# Patient Record
Sex: Male | Born: 1970 | Race: Black or African American | Hispanic: No | Marital: Married | Smoking: Current every day smoker
Health system: Southern US, Community
[De-identification: ages and names within clinical notes are randomized; demographics above are authoritative.]

## PROBLEM LIST (undated history)

## (undated) DIAGNOSIS — I10 Essential (primary) hypertension: Secondary | ICD-10-CM

## (undated) HISTORY — PX: ARTHROSCOPIC REPAIR ACL: SUR80

---

## 2021-01-29 ENCOUNTER — Emergency Department: Payer: Self-pay

## 2021-01-29 ENCOUNTER — Encounter: Payer: Self-pay | Admitting: Emergency Medicine

## 2021-01-29 ENCOUNTER — Emergency Department
Admission: EM | Admit: 2021-01-29 | Discharge: 2021-01-29 | Disposition: A | Discharge status: Home / Self Care | Payer: Self-pay | Attending: Emergency Medicine | Admitting: Emergency Medicine

## 2021-01-29 ENCOUNTER — Other Ambulatory Visit: Payer: Self-pay

## 2021-01-29 ENCOUNTER — Emergency Department
Admission: EM | Admit: 2021-01-29 | Discharge: 2021-01-29 | Discharge status: Against Medical Advice | Payer: Self-pay | Attending: Emergency Medicine | Admitting: Emergency Medicine

## 2021-01-29 ENCOUNTER — Other Ambulatory Visit: Payer: Self-pay | Admitting: Radiology

## 2021-01-29 DIAGNOSIS — M25552 Pain in left hip: Secondary | ICD-10-CM | POA: Insufficient documentation

## 2021-01-29 DIAGNOSIS — F10129 Alcohol abuse with intoxication, unspecified: Secondary | ICD-10-CM | POA: Insufficient documentation

## 2021-01-29 DIAGNOSIS — E876 Hypokalemia: Secondary | ICD-10-CM | POA: Insufficient documentation

## 2021-01-29 DIAGNOSIS — F10920 Alcohol use, unspecified with intoxication, uncomplicated: Secondary | ICD-10-CM

## 2021-01-29 DIAGNOSIS — S0291XA Unspecified fracture of skull, initial encounter for closed fracture: Secondary | ICD-10-CM

## 2021-01-29 DIAGNOSIS — S299XXA Unspecified injury of thorax, initial encounter: Secondary | ICD-10-CM | POA: Insufficient documentation

## 2021-01-29 DIAGNOSIS — S0219XA Other fracture of base of skull, initial encounter for closed fracture: Secondary | ICD-10-CM | POA: Insufficient documentation

## 2021-01-29 DIAGNOSIS — R110 Nausea: Secondary | ICD-10-CM | POA: Insufficient documentation

## 2021-01-29 DIAGNOSIS — T1490XA Injury, unspecified, initial encounter: Secondary | ICD-10-CM

## 2021-01-29 DIAGNOSIS — S0990XA Unspecified injury of head, initial encounter: Secondary | ICD-10-CM

## 2021-01-29 DIAGNOSIS — S0101XA Laceration without foreign body of scalp, initial encounter: Secondary | ICD-10-CM | POA: Insufficient documentation

## 2021-01-29 DIAGNOSIS — W108XXA Fall (on) (from) other stairs and steps, initial encounter: Secondary | ICD-10-CM | POA: Insufficient documentation

## 2021-01-29 DIAGNOSIS — W109XXA Fall (on) (from) unspecified stairs and steps, initial encounter: Secondary | ICD-10-CM | POA: Insufficient documentation

## 2021-01-29 LAB — COMPREHENSIVE METABOLIC PANEL
ALT: 19 U/L (ref 0–44)
AST: 25 U/L (ref 15–41)
Albumin: 4.7 g/dL (ref 3.5–5.0)
Alkaline Phosphatase: 55 U/L (ref 38–126)
Anion gap: 14 (ref 5–15)
BUN: 12 mg/dL (ref 6–20)
CO2: 23 mmol/L (ref 22–32)
Calcium: 8.9 mg/dL (ref 8.9–10.3)
Chloride: 97 mmol/L — ABNORMAL LOW (ref 98–111)
Creatinine, Ser: 0.91 mg/dL (ref 0.61–1.24)
GFR, Estimated: >60 mL/min (ref >60)
Glucose, Bld: 87 mg/dL (ref 70–99)
Potassium: 2.9 mmol/L — ABNORMAL LOW (ref 3.5–5.1)
Sodium: 134 mmol/L — ABNORMAL LOW (ref 135–145)
Total Bilirubin: 0.7 mg/dL (ref 0.3–1.2)
Total Protein: 7.6 g/dL (ref 6.5–8.1)

## 2021-01-29 LAB — CBC
HCT: 39.3 % (ref 39.0–52.0)
Hemoglobin: 13.9 g/dL (ref 13.0–17.0)
MCH: 28.9 pg (ref 26.0–34.0)
MCHC: 35.4 g/dL (ref 30.0–36.0)
MCV: 81.7 fL (ref 80.0–100.0)
Platelets: 439 10*3/uL — ABNORMAL HIGH (ref 150–400)
RBC: 4.81 MIL/uL (ref 4.22–5.81)
RDW: 11.9 % (ref 11.5–15.5)
WBC: 9.9 10*3/uL (ref 4.0–10.5)
nRBC: 0 % (ref 0.0–0.2)

## 2021-01-29 LAB — MAGNESIUM: Magnesium: 2.6 mg/dL — ABNORMAL HIGH (ref 1.7–2.4)

## 2021-01-29 LAB — ETHANOL: Alcohol, Ethyl (B): 256 mg/dL — ABNORMAL HIGH (ref <10)

## 2021-01-29 LAB — CBG MONITORING, ED: Glucose-Capillary: 94 mg/dL (ref 70–99)

## 2021-01-29 IMAGING — CT CT CERVICAL SPINE W/O CM
3 of 4 series · 10 of 33 positions shown, 12 images · non-contrast
Comparison: None.
COMPARISON: None.

Addendum:
CLINICAL DATA: Intoxicated, neck trauma.  Status post fall.

EXAM:
CT HEAD WITHOUT CONTRAST
CT CERVICAL SPINE WITHOUT CONTRAST
TECHNIQUE: Multidetector CT imaging of the head and cervical spine was
performed following the standard protocol without intravenous
contrast. Multiplanar CT image reconstructions of the cervical spine
were also generated.

[Series 6: orthogonal bone · axial · 0.26mm/px · z∈[-284,-197]mm · 2 of 109 slices shown, 3 images]
[im 31/109  soft-tissue]
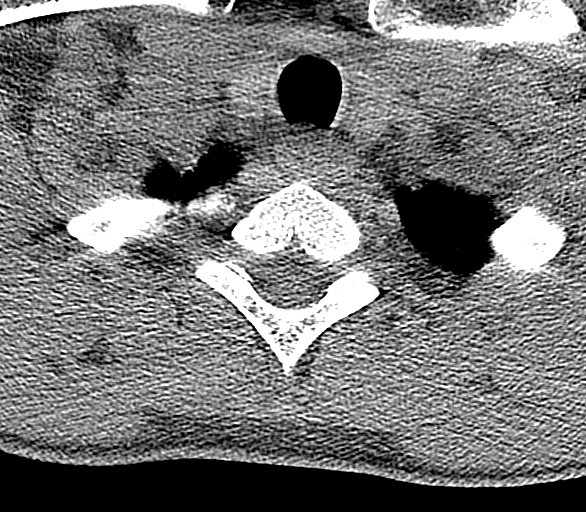
[im 31/109  bone]
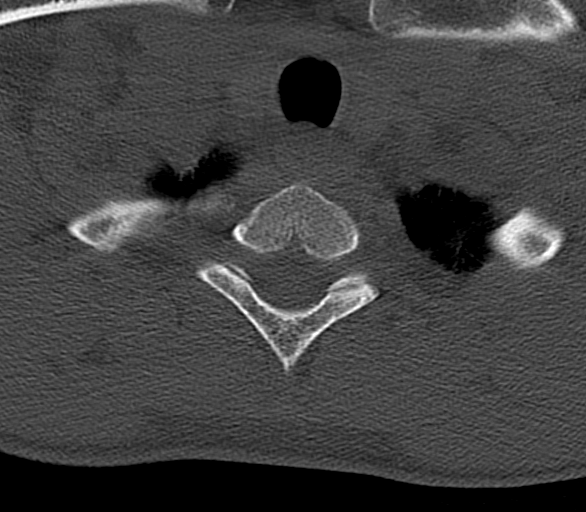
[im 78/109  bone]
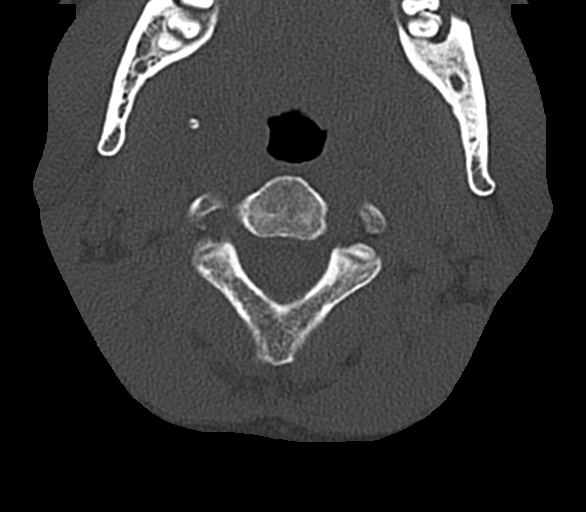

[Series 7: sagittal bone · sagittal · 0.27mm/px · 5 of 78 slices shown, 6 images]
[im 26/78  bone]
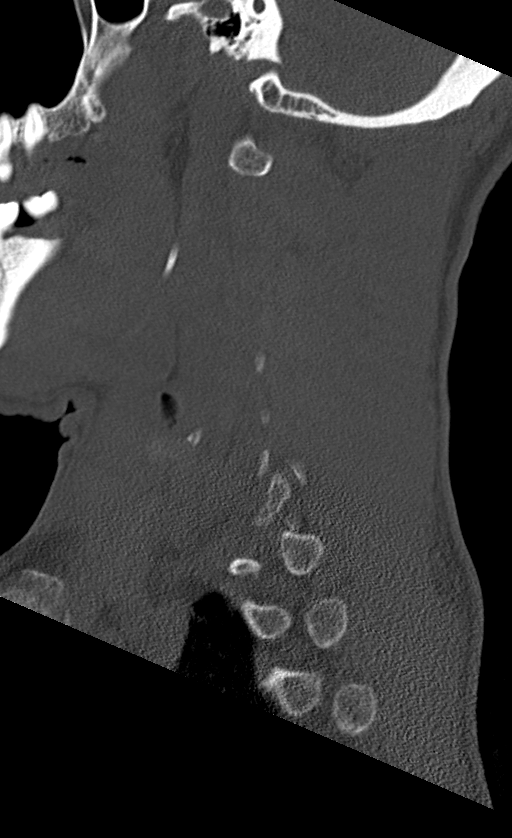
[im 33/78  bone]
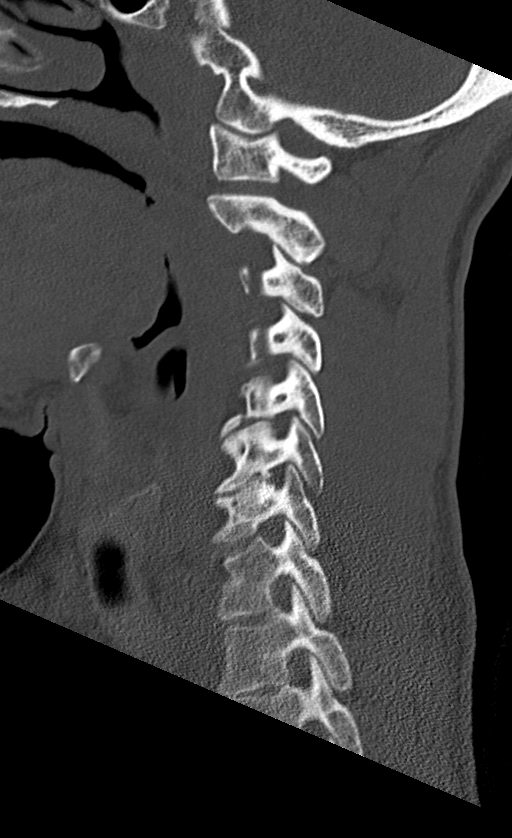
[im 39/78  soft-tissue]
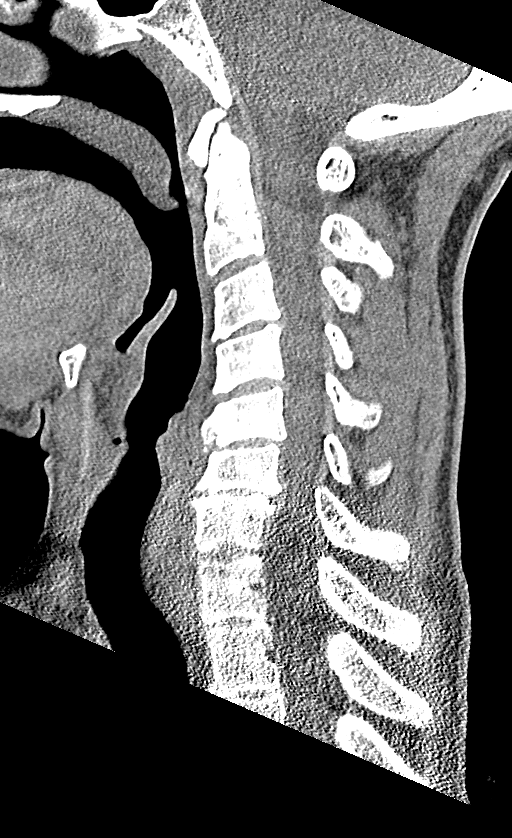
[im 39/78  bone]
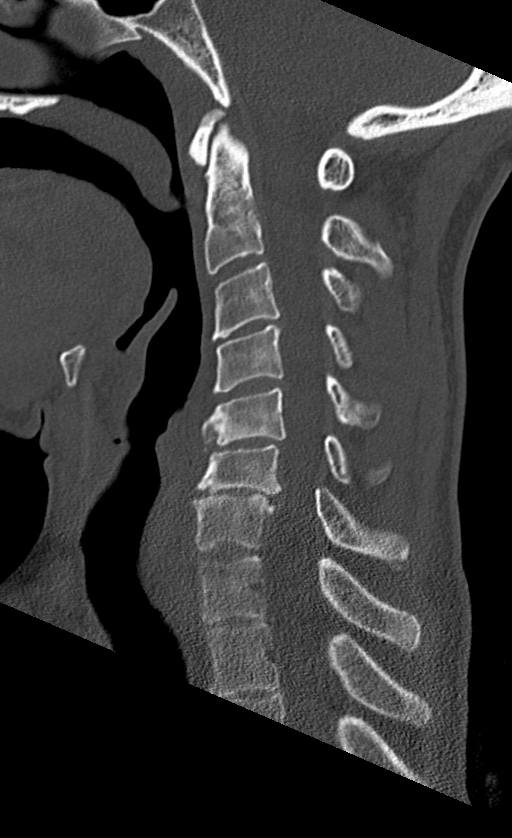
[im 45/78  bone]
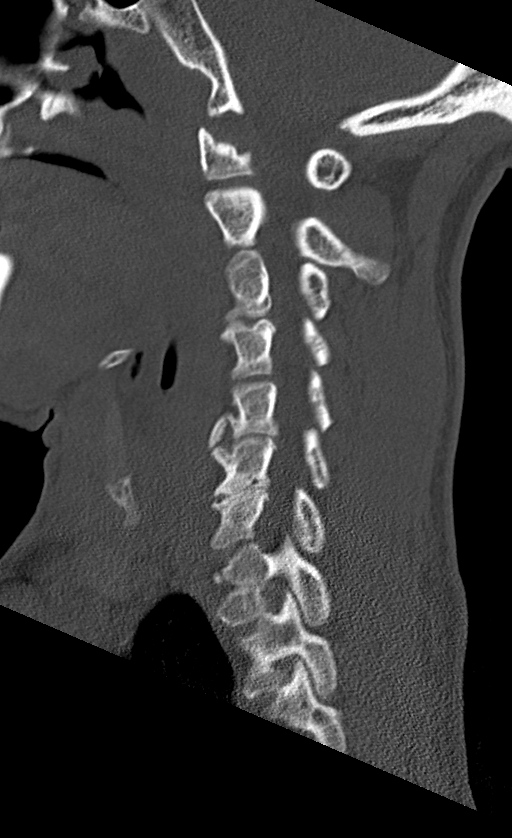
[im 52/78  bone]
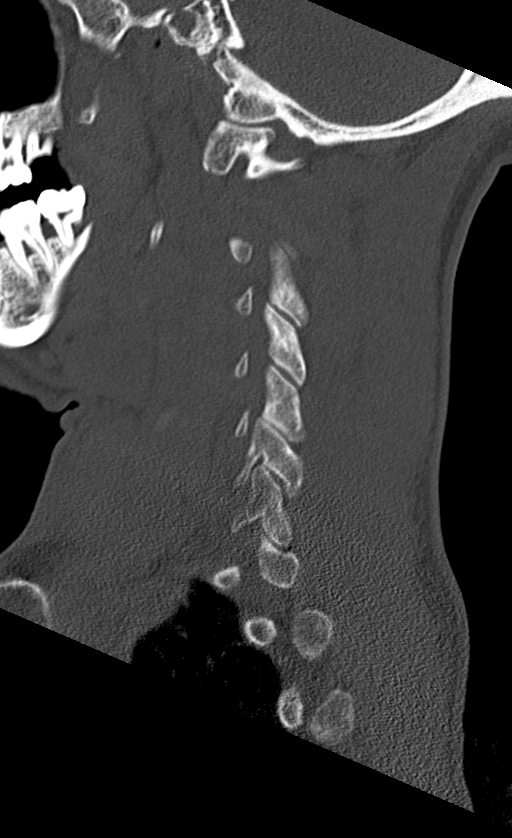

[Series 8: coronal bone · coronal · 0.28mm/px · 3 of 66 slices shown]
[im 14/66  bone]
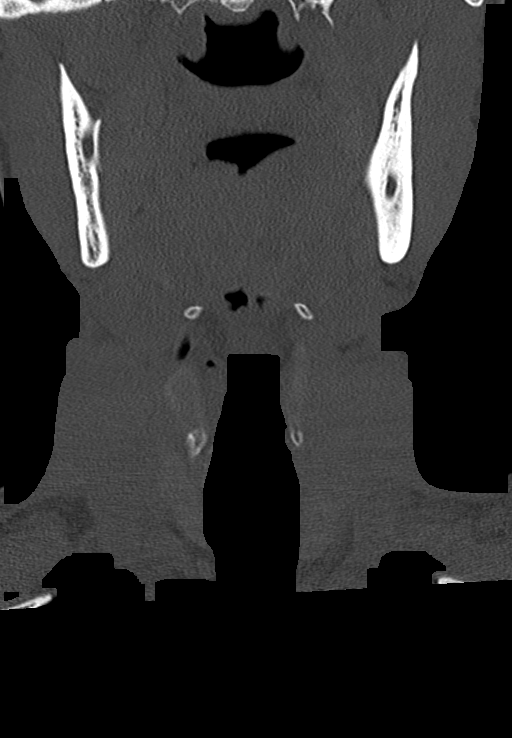
[im 27/66  bone]
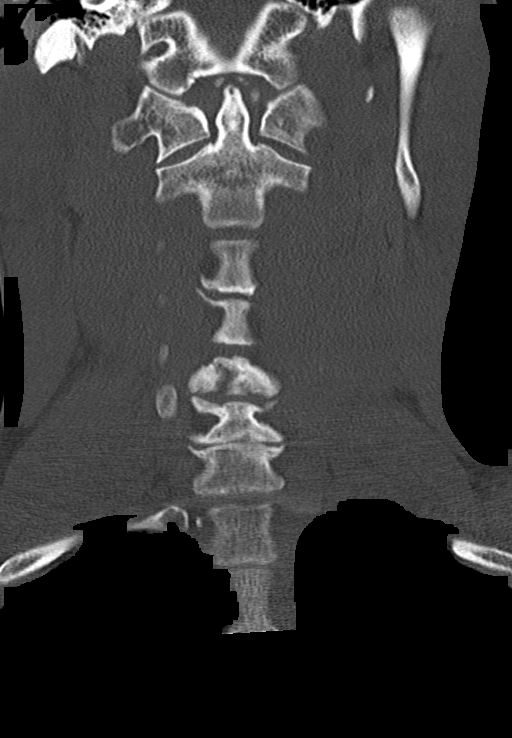
[im 40/66  bone]
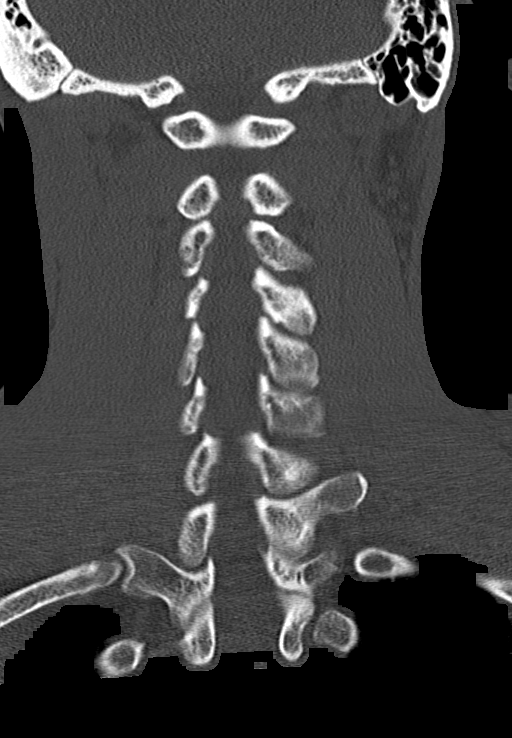

[10 of 33 positions shown; findings below may reference images not displayed]

FINDINGS: CT HEAD FINDINGS

Brain:

No pneumocephalus.

No evidence of large-territorial acute infarction. No parenchymal
hemorrhage. No mass lesion. No extra-axial collection.

No mass effect or midline shift. No hydrocephalus. Basilar cisterns
are patent.

Vascular: No hyperdense vessel.

Skull: Nondisplaced left temporal bone fracture extending inferiorly
to involve the mastoid air cells ([DATE]).

Sinuses/Orbits: Paranasal sinuses and mastoid air cells are clear.
The orbits are unremarkable.

Other: Subcutaneus soft tissue edema and emphysema along the left
temporal survey soft tissues and ears. Debris within bilateral
external auditory canals.

CT CERVICAL SPINE FINDINGS

Alignment: Reversal of the normal cervical lordosis likely due to
positioning and degenerative changes.

Skull base and vertebrae: Multilevel degenerative changes of the
spine that are most prominent at C5 through C7 levels. No acute
fracture. No aggressive appearing focal osseous lesion or focal
pathologic process.

Soft tissues and spinal canal: No prevertebral fluid or swelling. No
visible canal hematoma.

Disc levels: Moderate intervertebral disc space narrowing at the
C6-C7 level.

Upper chest: Biapical paraseptal emphysematous changes.

Other: None.
IMPRESSION: 1. Nondisplaced left squamous and petromastoid temporal bone
fracture. Overlying associated subcutaneus soft tissue edema and
emphysema.
2. Otherwise no acute intracranial abnormality.
3. No acute displaced fracture or traumatic listhesis of the
cervical spine.
4. Debris within bilateral external auditory canals. Finding could
represent cerumen. Recommend correlation with physical exam.
5.  Emphysema ([OQ]-[OQ]).

ADDENDUM:
These results were called by telephone at the time of interpretation
on [DATE] at [DATE] to provider WOO CHUL , who verbally
acknowledged these results.

*** End of Addendum ***
FINDINGS: CT HEAD FINDINGS

Brain:

No pneumocephalus.

No evidence of large-territorial acute infarction. No parenchymal
hemorrhage. No mass lesion. No extra-axial collection.

No mass effect or midline shift. No hydrocephalus. Basilar cisterns
are patent.

Vascular: No hyperdense vessel.

Skull: Nondisplaced left temporal bone fracture extending inferiorly
to involve the mastoid air cells ([DATE]).

Sinuses/Orbits: Paranasal sinuses and mastoid air cells are clear.
The orbits are unremarkable.

Other: Subcutaneus soft tissue edema and emphysema along the left
temporal survey soft tissues and ears. Debris within bilateral
external auditory canals.

CT CERVICAL SPINE FINDINGS

Alignment: Reversal of the normal cervical lordosis likely due to
positioning and degenerative changes.

Skull base and vertebrae: Multilevel degenerative changes of the
spine that are most prominent at C5 through C7 levels. No acute
fracture. No aggressive appearing focal osseous lesion or focal
pathologic process.

Soft tissues and spinal canal: No prevertebral fluid or swelling. No
visible canal hematoma.

Disc levels: Moderate intervertebral disc space narrowing at the
C6-C7 level.

Upper chest: Biapical paraseptal emphysematous changes.

Other: None.
IMPRESSION: 1. Nondisplaced left squamous and petromastoid temporal bone
fracture. Overlying associated subcutaneus soft tissue edema and
emphysema.
2. Otherwise no acute intracranial abnormality.
3. No acute displaced fracture or traumatic listhesis of the
cervical spine.
4. Debris within bilateral external auditory canals. Finding could
represent cerumen. Recommend correlation with physical exam.
5.  Emphysema ([OQ]-[OQ]).

## 2021-01-29 MED ORDER — BACITRACIN ZINC 500 UNIT/GM EX OINT
TOPICAL_OINTMENT | Freq: Once | CUTANEOUS | Status: AC
  Filled 2021-01-29: qty 0.9

## 2021-01-29 MED ORDER — FENTANYL CITRATE (PF) 100 MCG/2ML IJ SOLN
50.0000 ug | Freq: Once | INTRAMUSCULAR | Status: AC
  Administered 2021-01-29: 50 ug via INTRAVENOUS
  Filled 2021-01-29: qty 2

## 2021-01-29 MED ORDER — IOHEXOL 350 MG/ML SOLN
75.0000 mL | Freq: Once | INTRAVENOUS | Status: AC | PRN
  Administered 2021-01-29: 75 mL via INTRAVENOUS

## 2021-01-29 MED ORDER — ACETAMINOPHEN 500 MG PO TABS
1000.0000 mg | ORAL_TABLET | Freq: Once | ORAL | Status: AC
  Administered 2021-01-29: 1000 mg via ORAL
  Filled 2021-01-29: qty 2

## 2021-01-29 MED ORDER — POTASSIUM CHLORIDE CRYS ER 20 MEQ PO TBCR
40.0000 meq | EXTENDED_RELEASE_TABLET | Freq: Once | ORAL | Status: AC
  Administered 2021-01-29: 40 meq via ORAL
  Filled 2021-01-29: qty 2

## 2021-01-29 MED ORDER — SODIUM CHLORIDE 0.9 % IV SOLN: Freq: Once | INTRAVENOUS | Status: AC

## 2021-01-29 MED ORDER — SODIUM CHLORIDE 0.9 % IV BOLUS (SEPSIS)
1000.0000 mL | Freq: Once | INTRAVENOUS | Status: AC
  Administered 2021-01-29: 1000 mL via INTRAVENOUS

## 2021-01-29 MED ORDER — POTASSIUM CHLORIDE 10 MEQ/100ML IV SOLN
10.0000 meq | INTRAVENOUS | Status: AC
  Administered 2021-01-29 (×2): 10 meq via INTRAVENOUS
  Filled 2021-01-29 (×2): qty 100

## 2021-01-29 MED ORDER — LIDOCAINE-EPINEPHRINE 2 %-1:100000 IJ SOLN
20.0000 mL | Freq: Once | INTRAMUSCULAR | Status: AC
  Administered 2021-01-29: 20 mL via INTRADERMAL
  Filled 2021-01-29: qty 1

## 2021-01-29 MED ORDER — ONDANSETRON HCL 4 MG/2ML IJ SOLN
4.0000 mg | Freq: Once | INTRAMUSCULAR | Status: AC
  Administered 2021-01-29: 4 mg via INTRAVENOUS
  Filled 2021-01-29: qty 2

## 2021-01-29 NOTE — ED Notes (Signed)
Wife called, said "we were supposed to call her" when potassium was finished. Informed wife that pt is able to leave and she can come pick him up. Pt is yelling, cursing. Per EDP, pt is eloping not being discharged because he is refusing to allow staff to communicate with him and continues to curse and yell. Security officer escorted pt out of hospital.

## 2021-01-29 NOTE — Discharge Instructions (Signed)
Please use Tylenol (no more than 4g a day) for any continued headaches

## 2021-01-29 NOTE — ED Notes (Signed)
Report to reina, rn.  

## 2021-01-29 NOTE — ED Notes (Signed)
Pt back to ct

## 2021-01-29 NOTE — ED Notes (Signed)
Patient transported to CT and xray 

## 2021-01-29 NOTE — ED Triage Notes (Signed)
Per ems pt with fall down steps tonight. Per ems pt's familyfound him post fall. Pt last remembers walking up steps, does not remember falling. Pt with ETOH and cocaine use today. Pt with laceration approx 2cm to left lateral scalp. Pt alert and oriented x4 currently.

## 2021-01-29 NOTE — ED Provider Notes (Signed)
[facility] Emergency Department Provider Note   ____________________________________________   None    (approximate)  I have reviewed the triage vital signs and the nursing notes.   HISTORY  Chief Complaint Head Laceration    HPI Rick Kelly is a 50 y.o. male who checked back in after eloping from a visit overnight this last night after a fall downstairs due to alcohol intoxication and suffering a head injury that required repair at bedside. Patient now complains of persistent headache and mild nausea. Patient describes a headache as throbbing, 10/10, nonradiating, and partially centered around the area of his laceration to the scalp. Patient currently denies any vision changes, tinnitus, difficulty speaking, facial droop, sore throat, chest pain, shortness of breath, abdominal pain, nausea/vomiting/diarrhea, dysuria, or weakness/numbness/paresthesias in any extremity         History reviewed. No pertinent past medical history.  There are no problems to display for this patient.   History reviewed. No pertinent surgical history.  Prior to Admission medications   Not on File    Allergies Patient has no known allergies.  History reviewed. No pertinent family history.  Social History Social History   Substance Use Topics  . Alcohol use: Yes  . Drug use: Yes    Review of Systems Constitutional: No fever/chills Eyes: No visual changes. ENT: No sore throat. Cardiovascular: Denies chest pain. Respiratory: Denies shortness of breath. Gastrointestinal: No abdominal pain.  No nausea, no vomiting.  No diarrhea. Genitourinary: Negative for dysuria. Musculoskeletal: Negative for acute arthralgias Skin: Negative for rash. Neurological: Endorses for headaches, denies any weakness/numbness/paresthesias in any extremity Psychiatric: Negative for suicidal ideation/homicidal  ideation   ____________________________________________   PHYSICAL EXAM:  VITAL SIGNS: ED Triage Vitals  Enc Vitals Group     BP 01/29/21 0823 130/88     Pulse Rate 01/29/21 0823 91     Resp 01/29/21 0823 20     Temp 01/29/21 0823 97.9 F (36.6 C)     Temp Source 01/29/21 0823 Oral     SpO2 01/29/21 0823 100 %     Weight 01/29/21 0831 137 lb (62.1 kg)     Height 01/29/21 0831 5\' 7"  (1.702 m)     Head Circumference --      Peak Flow --      Pain Score 01/29/21 0831 10     Pain Loc --      Pain Edu? --      Excl. in GC? --    Constitutional: Alert and oriented. Well appearing and in no acute distress. Eyes: Conjunctivae are normal. PERRL. Head/ears: Laceration to the left mid parietal scalp. No hemotympanum Nose: No congestion/rhinnorhea. Mouth/Throat: Mucous membranes are moist. Neck: No stridor Cardiovascular: Grossly normal heart sounds.  Good peripheral circulation. Respiratory: Normal respiratory effort.  No retractions. Gastrointestinal: Soft and nontender. No distention. Musculoskeletal: No obvious deformities Neurologic:  Normal speech and language. No gross focal neurologic deficits are appreciated. Skin:  Skin is warm and dry. No rash noted. Psychiatric: Mood and affect are normal. Speech and behavior are normal.  ____________________________________________   LABS (all labs ordered are listed, but only abnormal results are displayed)  Labs Reviewed - No data to display  RADIOLOGY  Official radiology report(s): CT Head Wo Contrast  Addendum Date: 01/29/2021   ADDENDUM REPORT: 01/29/2021 02:13 ADDENDUM: These results were called by telephone at the time of interpretation on 01/29/2021 at 2:10 am to provider [facility] , who verbally acknowledged these results. Electronically Signed  By: Tish Frederickson M.D.   On: 01/29/2021 02:13   Result Date: 01/29/2021 CLINICAL DATA:  Intoxicated, neck trauma.  Status post fall. EXAM: CT HEAD WITHOUT CONTRAST CT  CERVICAL SPINE WITHOUT CONTRAST TECHNIQUE: Multidetector CT imaging of the head and cervical spine was performed following the standard protocol without intravenous contrast. Multiplanar CT image reconstructions of the cervical spine were also generated. COMPARISON:  None. FINDINGS: CT HEAD FINDINGS Brain: No pneumocephalus. No evidence of large-territorial acute infarction. No parenchymal hemorrhage. No mass lesion. No extra-axial collection. No mass effect or midline shift. No hydrocephalus. Basilar cisterns are patent. Vascular: No hyperdense vessel. Skull: Nondisplaced left temporal bone fracture extending inferiorly to involve the mastoid air cells (3:19-40). Sinuses/Orbits: Paranasal sinuses and mastoid air cells are clear. The orbits are unremarkable. Other: Subcutaneus soft tissue edema and emphysema along the left temporal survey soft tissues and ears. Debris within bilateral external auditory canals. CT CERVICAL SPINE FINDINGS Alignment: Reversal of the normal cervical lordosis likely due to positioning and degenerative changes. Skull base and vertebrae: Multilevel degenerative changes of the spine that are most prominent at C5 through C7 levels. No acute fracture. No aggressive appearing focal osseous lesion or focal pathologic process. Soft tissues and spinal canal: No prevertebral fluid or swelling. No visible canal hematoma. Disc levels: Moderate intervertebral disc space narrowing at the C6-C7 level. Upper chest: Biapical paraseptal emphysematous changes. Other: None. IMPRESSION: 1. Nondisplaced left squamous and petromastoid temporal bone fracture. Overlying associated subcutaneus soft tissue edema and emphysema. 2. Otherwise no acute intracranial abnormality. 3. No acute displaced fracture or traumatic listhesis of the cervical spine. 4. Debris within bilateral external auditory canals. Finding could represent cerumen. Recommend correlation with physical exam. 5.  Emphysema (ICD10-J43.9).  Electronically Signed: By: Tish Frederickson M.D. On: 01/29/2021 02:06   CT Angio Neck W and/or Wo Contrast  Result Date: 01/29/2021 CLINICAL DATA:  Larey Seat down the steps tonight. EXAM: CT ANGIOGRAPHY NECK TECHNIQUE: Multidetector CT imaging of the neck was performed using the standard protocol during bolus administration of intravenous contrast. Multiplanar CT image reconstructions and MIPs were obtained to evaluate the vascular anatomy. Carotid stenosis measurements (when applicable) are obtained utilizing NASCET criteria, using the distal internal carotid diameter as the denominator. CONTRAST:  56mL OMNIPAQUE IOHEXOL 350 MG/ML SOLN COMPARISON:  None. FINDINGS: Aortic arch: Normal Right carotid system: Normal. No bifurcation disease. No dissection. Left carotid system: Normal. No bifurcation disease. No dissection. Vertebral arteries: Both vertebral arteries are normal. Left is dominant. No stenosis or dissection. Skeleton: Mid cervical spondylosis.  No acute traumatic finding. Other neck: No soft tissue mass or lymphadenopathy. No traumatic soft tissue finding. Upper chest: Normal IMPRESSION: Normal CT angiography of the neck vessels. No evidence of vascular injury. No atherosclerotic change or dissection. No traumatic soft tissue finding. Electronically Signed   By: Paulina Fusi M.D.   On: 01/29/2021 03:22   CT Cervical Spine Wo Contrast  Addendum Date: 01/29/2021   ADDENDUM REPORT: 01/29/2021 02:13 ADDENDUM: These results were called by telephone at the time of interpretation on 01/29/2021 at 2:10 am to provider [facility] , who verbally acknowledged these results. Electronically Signed   By: Tish Frederickson M.D.   On: 01/29/2021 02:13   Result Date: 01/29/2021 CLINICAL DATA:  Intoxicated, neck trauma.  Status post fall. EXAM: CT HEAD WITHOUT CONTRAST CT CERVICAL SPINE WITHOUT CONTRAST TECHNIQUE: Multidetector CT imaging of the head and cervical spine was performed following the standard protocol without  intravenous contrast. Multiplanar CT image reconstructions of  the cervical spine were also generated. COMPARISON:  None. FINDINGS: CT HEAD FINDINGS Brain: No pneumocephalus. No evidence of large-territorial acute infarction. No parenchymal hemorrhage. No mass lesion. No extra-axial collection. No mass effect or midline shift. No hydrocephalus. Basilar cisterns are patent. Vascular: No hyperdense vessel. Skull: Nondisplaced left temporal bone fracture extending inferiorly to involve the mastoid air cells (3:19-40). Sinuses/Orbits: Paranasal sinuses and mastoid air cells are clear. The orbits are unremarkable. Other: Subcutaneus soft tissue edema and emphysema along the left temporal survey soft tissues and ears. Debris within bilateral external auditory canals. CT CERVICAL SPINE FINDINGS Alignment: Reversal of the normal cervical lordosis likely due to positioning and degenerative changes. Skull base and vertebrae: Multilevel degenerative changes of the spine that are most prominent at C5 through C7 levels. No acute fracture. No aggressive appearing focal osseous lesion or focal pathologic process. Soft tissues and spinal canal: No prevertebral fluid or swelling. No visible canal hematoma. Disc levels: Moderate intervertebral disc space narrowing at the C6-C7 level. Upper chest: Biapical paraseptal emphysematous changes. Other: None. IMPRESSION: 1. Nondisplaced left squamous and petromastoid temporal bone fracture. Overlying associated subcutaneus soft tissue edema and emphysema. 2. Otherwise no acute intracranial abnormality. 3. No acute displaced fracture or traumatic listhesis of the cervical spine. 4. Debris within bilateral external auditory canals. Finding could represent cerumen. Recommend correlation with physical exam. 5.  Emphysema (ICD10-J43.9). Electronically Signed: By: Tish Frederickson M.D. On: 01/29/2021 02:06   CT CHEST ABDOMEN PELVIS W CONTRAST  Result Date: 01/29/2021 CLINICAL DATA:  Abdominal  trauma. Status post fall. Alcohol and cocaine use. EXAM: CT CHEST, ABDOMEN, AND PELVIS WITH CONTRAST TECHNIQUE: Multidetector CT imaging of the chest, abdomen and pelvis was performed following the standard protocol during bolus administration of intravenous contrast. CONTRAST:  71mL OMNIPAQUE IOHEXOL 350 MG/ML SOLN COMPARISON:  None. FINDINGS: CHEST: Ports and Devices: None. Lungs/airways: Mild paraseptal emphysematous changes. No focal consolidation. No pulmonary nodule. No pulmonary mass. No pulmonary contusion or laceration. No pneumatocele formation. The central airways are patent. Pleura: No pleural effusion. No pneumothorax. No hemothorax. Lymph Nodes: No mediastinal, hilar, or axillary lymphadenopathy. Mediastinum: No pneumomediastinum. No aortic injury or mediastinal hematoma. The thoracic aorta is normal in caliber. The heart is normal in size. No significant pericardial effusion. The esophagus is unremarkable. The thyroid is unremarkable. Chest Wall / Breasts: No chest wall mass. Musculoskeletal: No acute rib or sternal fracture. No spinal fracture. Multilevel degenerative changes of the spine worse in the mid thoracic vertebral bodies with associated endplate sclerosis and severe intervertebral disc space narrowing. ABDOMEN / PELVIS: Liver: Not enlarged. Pericentimeter fluid density lesion within the right hepatic lobe likely represents a simple hepatic cyst. Vague hypodensity within the left hepatic lobe along the false form ligament likely represents focal fatty infiltration. Subcentimeter hypodensities are too small to characterize. Otherwise no focal lesion. No laceration or subcapsular hematoma. Biliary System: The gallbladder is otherwise unremarkable with no radio-opaque gallstones. No biliary ductal dilatation. Pancreas: Normal pancreatic contour. No main pancreatic duct dilatation. Spleen: Not enlarged. No focal lesion. No laceration, subcapsular hematoma, or vascular injury. Adrenal Glands: No  nodularity bilaterally. Kidneys: Bilateral kidneys enhance symmetrically. No hydronephrosis. No contusion, laceration, or subcapsular hematoma. No injury to the vascular structures or collecting systems. No hydroureter. The urinary bladder is distended with urine and grossly unremarkable. Bowel: PO contrast only partially opacifies the proximal small bowel. Underdistended stomach with prominent gastric rugae. No small or large bowel wall thickening or dilatation. No definite pneumatosis. The appendix is not definitely  identified. Mesentery, Omentum, and Peritoneum: No simple free fluid ascites. No pneumoperitoneum. No hemoperitoneum. No mesenteric hematoma identified. No organized fluid collection. Pelvic Organs: Normal. Lymph Nodes: No abdominal, pelvic, inguinal lymphadenopathy. Vasculature: No abdominal aorta or iliac aneurysm. Moderate atherosclerotic plaque of the aorta and its branches. Musculoskeletal: No significant soft tissue hematoma. No acute pelvic fracture. No lumbar spinal fracture. Possible nondisplaced fracture of the S3/S4 vertebral bodies. L5-S1 intervertebral disc space vacuum phenomenon and endplate sclerosis. Trace retrolisthesis of L5 on S1. IMPRESSION: 1. No acute traumatic injury to the chest, abdomen, or pelvis. 2. No acute fracture or traumatic malalignment of the thoracic or lumbar spine. 3. Possible nondisplaced fracture of the S3/S4 vertebral bodies. Correlate with tenderness to palpation. 4. Underdistended stomach with prominent gastric rugae. Correlate with signs and symptoms of gastritis. 5. Aortic Atherosclerosis (ICD10-I70.0) and Emphysema (ICD10-J43.9). Electronically Signed   By: Tish FredericksonMorgane  Naveau M.D.   On: 01/29/2021 03:40   DG Hip Unilat W or Wo Pelvis 2-3 Views Left  Result Date: 01/29/2021 CLINICAL DATA:  Status post fall. EXAM: DG HIP (WITH OR WITHOUT PELVIS) 2-3V LEFT COMPARISON:  None. FINDINGS: There is no evidence of hip fracture or dislocation. There is no evidence of  arthropathy or other focal bone abnormality. IMPRESSION: Negative. Electronically Signed   By: Tish FredericksonMorgane  Naveau M.D.   On: 01/29/2021 02:13    ____________________________________________   PROCEDURES  Procedure(s) performed (including Critical Care):  .1-3 Lead EKG Interpretation Performed by: Merwyn KatosBradler, Siddharth Babington K, MD Authorized by: Merwyn KatosBradler, Kerie Badger K, MD     Interpretation: normal     ECG rate:  90   ECG rate assessment: normal     Rhythm: sinus rhythm     Ectopy: none     Conduction: normal       ____________________________________________   INITIAL IMPRESSION / ASSESSMENT AND PLAN / ED COURSE  As part of my medical decision making, I reviewed the following data within the electronic MEDICAL RECORD NUMBER Nursing notes reviewed and incorporated, Labs reviewed, EKG interpreted, Old chart reviewed, Radiograph reviewed and Notes from prior ED visits reviewed and incorporated        Patient presents ambulatory complaining of headache after a fall down multiple steps resulting in a head injury approximately 8 hours prior to this visit. Differential diagnosis includes but is not limited to: Scalp laceration, basilar skull fracture, CVA  Patient had full work-up overnight during his previous visit and exhibits no new symptoms other than what was documented overnight. Patient was advised to use Tylenol and ice packs for any continued pain. Patient is speaking in complete sentences without any slurred speech or difficulty with ambulation. Patient is safe for discharge at this time with neurosurgical follow-up as well as a safe ride home.  The patient has been reexamined and is ready to be discharged.  All diagnostic results have been reviewed and discussed with the patient/family.  Care plan has been outlined and the patient/family understands all current diagnoses, results, and treatment plans.  There are no new complaints, changes, or physical findings at this time.  All questions have been  addressed and answered.  Patient was instructed to, and agrees to follow-up with their primary care physician as well as return to the emergency department if any new or worsening symptoms develop.      ____________________________________________   FINAL CLINICAL IMPRESSION(S) / ED DIAGNOSES  Final diagnoses:  Injury of head, initial encounter  Laceration of scalp, initial encounter  Alcoholic intoxication without complication (HCC)  ED Discharge Orders    None       Note:  This document was prepared using Dragon voice recognition software and may include unintentional dictation errors.   Merwyn Katos, MD 01/29/21 [phone]

## 2021-01-29 NOTE — ED Triage Notes (Signed)
Pt checked back in after being escorted out by security following a fall with head laceration and altercation with ED staff. Pt states to this RN "I'm upset because I was told they wouldn't give me any medicine and my head is throbbing and my jaw is throbbing". Pt in NAD, ambulatory without difficulty in the lobby initially, however now is in wheelchair in triage room, and able to speak in full and complete sentences with this RN.   This RN spoke with EDP Bradler regarding previous care and new orders, EDP Bradler states will come re-assess patient and speak with patient.

## 2021-01-29 NOTE — Discharge Instructions (Addendum)
You may clean the wound to your scalp gently with warm soap and water 1-2 times a day and apply over-the-counter Neosporin ointment and a large Band-Aid.  You will need to have your staples removed in 7 to 10 days.  This can be done by your primary care physician, urgent care or in the ED.  Your CT imaging showed a fracture at the base of your skull.  We have discussed your case with neurosurgery and they recommend follow-up in the office in 2 weeks.  I recommend you avoid any activity that may lead to another head injury for the next 2 weeks.  Please avoid alcohol and illicit drugs.  If you begin leaking clear fluid from your nose, have numbness or weakness on 1 side of your body, changes in vision, severe headache that is uncontrolled with medications, vomiting does not stop, please return to the emergency department.  Your potassium level today was slightly low at 2.9.  This was replaced in the emergency department.  I recommend you have this rechecked by a primary care physician in 1 week.   Steps to find a Primary Care Provider (PCP):  Call [phone] or [phone] to access "[facility] Find a Doctor Service."  2.  You may also go on the [facility] website at [email or web address]

## 2021-01-29 NOTE — ED Notes (Signed)
KCL in ns rate slowed to 58mL/hr due to burning at iv site.

## 2021-01-29 NOTE — ED Notes (Signed)
Pt came walking into hallway from room 10. Pt yelling at [employer] operational officer. Primary RN walked over and pt began yelling at primary RN. Pt complaining that he has been in his room and his IV pump has been beeping and he hasn't gotten any assistance. Pt continues to yell at staff. Secretary provided pt with phone to call for ride. Primary RN in room to remove IV's so pt can walk out. Pt continues to be verbally abusive to staff.

## 2021-01-29 NOTE — ED Notes (Signed)
Pt got out of bed and came out to nurse's station complaining that he is not getting care, has been hitting his call bell and no one has come. Cursing and raising voice, demanding to use phone to call wife. Pt steady on feet. Provided water to pt. Pt drinking without difficulty. IV removed.

## 2021-01-29 NOTE — ED Provider Notes (Signed)
[facility] Emergency Department Provider Note ____________________________________________   Event Date/Time   First MD Initiated Contact with Patient 01/29/21 0041     (approximate)  I have reviewed the triage vital signs and the nursing notes.   HISTORY  Chief Complaint Head Injury    HPI Rick Kelly is a 50 y.o. male who presents to the emergency department with EMS after he fell down stairs tonight.  + LOC.  He reports drinking alcohol and using cocaine tonight.  He states he is not sure what caused him to fall.  Has a laceration to the left side of the scalp.  Reports his last tetanus vaccination was a year ago.  Complaining of left hip pain and left-sided headache.  No new neck or back pain.  States he has chronic low back pain.  No numbness, tingling, weakness.  No chest pain, shortness of breath, abdominal pain.         History reviewed. No pertinent past medical history.  There are no problems to display for this patient.   History reviewed. No pertinent surgical history.  Prior to Admission medications   Not on File    Allergies Patient has no known allergies.  History reviewed. No pertinent family history.  Social History Social History   Substance Use Topics  . Alcohol use: Yes  . Drug use: Yes    Review of Systems Constitutional: No fever. Eyes: No visual changes. ENT: No sore throat. Cardiovascular: Denies chest pain. Respiratory: Denies shortness of breath. Gastrointestinal: No nausea, vomiting, diarrhea. Genitourinary: Negative for dysuria. Musculoskeletal: Negative for acute back pain. Skin: Negative for rash. Neurological: Negative for focal weakness or numbness.   ____________________________________________   PHYSICAL EXAM:  VITAL SIGNS: ED Triage Vitals  Enc Vitals Group     BP 01/29/21 0039 (!) 147/94     Pulse Rate 01/29/21 0039 66     Resp 01/29/21 0039 14     Temp 01/29/21 0042  97.8 F (36.6 C)     Temp Source 01/29/21 0039 Oral     SpO2 01/29/21 0039 98 %     Weight 01/29/21 0040 137 lb (62.1 kg)     Height 01/29/21 0040 5\' 7"  (1.702 m)     Head Circumference --      Peak Flow --      Pain Score 01/29/21 0106 6     Pain Loc --      Pain Edu? --      Excl. in GC? --    CONSTITUTIONAL: Alert and oriented and responds appropriately to questions. Well-appearing; well-nourished; GCS 15, intoxicated HEAD: Normocephalic; 4 cm superficial laceration to the left scalp EYES: Conjunctivae clear, PERRL, EOMI ENT: normal nose; no rhinorrhea; moist mucous membranes; pharynx without lesions noted; no dental injury; no septal hematoma NECK: Supple, no meningismus, no LAD; no midline spinal tenderness, step-off or deformity; trachea midline CARD: RRR; S1 and S2 appreciated; no murmurs, no clicks, no rubs, no gallops RESP: Normal chest excursion without splinting or tachypnea; breath sounds clear and equal bilaterally; no wheezes, no rhonchi, no rales; no hypoxia or respiratory distress CHEST:  chest wall stable, no crepitus or ecchymosis or deformity, nontender to palpation; no flail chest ABD/GI: Normal bowel sounds; non-distended; soft, non-tender, no rebound, no guarding; no ecchymosis or other lesions noted PELVIS:  stable, tender to palpation over the left lateral hip without deformity or leg length discrepancy BACK:  The back appears normal and is non-tender to palpation, there  is no CVA tenderness; no midline spinal tenderness, step-off or deformity EXT: Normal ROM in all joints; non-tender to palpation; no edema; normal capillary refill; no cyanosis, no bony tenderness or bony deformity of patient's extremities, no joint effusion, compartments are soft, extremities are warm and well-perfused, no ecchymosis SKIN: Normal color for age and race; warm NEURO: Moves all extremities equally, reports normal sensation diffusely, normal speech, no facial asymmetry PSYCH: The  patient's mood and manner are appropriate. Grooming and personal hygiene are appropriate.  ____________________________________________   LABS (all labs ordered are listed, but only abnormal results are displayed)  Labs Reviewed  CBC - Abnormal; Notable for the following components:      Result Value   Platelets 439 (*)    All other components within normal limits  COMPREHENSIVE METABOLIC PANEL - Abnormal; Notable for the following components:   Sodium 134 (*)    Potassium 2.9 (*)    Chloride 97 (*)    All other components within normal limits  ETHANOL - Abnormal; Notable for the following components:   Alcohol, Ethyl (B) 256 (*)    All other components within normal limits  MAGNESIUM - Abnormal; Notable for the following components:   Magnesium 2.6 (*)    All other components within normal limits  CBG MONITORING, ED   ____________________________________________  EKG   Date: 01/29/2021  00:42   Rate: 69  Rhythm: normal sinus rhythm  QRS Axis: normal  Intervals: normal  ST/T Wave abnormalities: normal  Conduction Disutrbances: none  Narrative Interpretation: unremarkable, QRS 90 ms, QTC 410 ms     ____________________________________________  RADIOLOGY I, Dorrie Cocuzza, personally viewed and evaluated these images (plain radiographs) as part of my medical decision making, as well as reviewing the written report by the radiologist.  ED MD interpretation: Imaging shows fracture of the base of the skull.  Possible S3/S4 vertebral body fractures.  Official radiology report(s): CT Head Wo Contrast  Addendum Date: 01/29/2021   ADDENDUM REPORT: 01/29/2021 02:13 ADDENDUM: These results were called by telephone at the time of interpretation on 01/29/2021 at 2:10 am to provider [facility] , who verbally acknowledged these results. Electronically Signed   By: Tish Frederickson M.D.   On: 01/29/2021 02:13   Result Date: 01/29/2021 CLINICAL DATA:  Intoxicated, neck trauma.  Status  post fall. EXAM: CT HEAD WITHOUT CONTRAST CT CERVICAL SPINE WITHOUT CONTRAST TECHNIQUE: Multidetector CT imaging of the head and cervical spine was performed following the standard protocol without intravenous contrast. Multiplanar CT image reconstructions of the cervical spine were also generated. COMPARISON:  None. FINDINGS: CT HEAD FINDINGS Brain: No pneumocephalus. No evidence of large-territorial acute infarction. No parenchymal hemorrhage. No mass lesion. No extra-axial collection. No mass effect or midline shift. No hydrocephalus. Basilar cisterns are patent. Vascular: No hyperdense vessel. Skull: Nondisplaced left temporal bone fracture extending inferiorly to involve the mastoid air cells (3:19-40). Sinuses/Orbits: Paranasal sinuses and mastoid air cells are clear. The orbits are unremarkable. Other: Subcutaneus soft tissue edema and emphysema along the left temporal survey soft tissues and ears. Debris within bilateral external auditory canals. CT CERVICAL SPINE FINDINGS Alignment: Reversal of the normal cervical lordosis likely due to positioning and degenerative changes. Skull base and vertebrae: Multilevel degenerative changes of the spine that are most prominent at C5 through C7 levels. No acute fracture. No aggressive appearing focal osseous lesion or focal pathologic process. Soft tissues and spinal canal: No prevertebral fluid or swelling. No visible canal hematoma. Disc levels: Moderate intervertebral disc space  narrowing at the C6-C7 level. Upper chest: Biapical paraseptal emphysematous changes. Other: None. IMPRESSION: 1. Nondisplaced left squamous and petromastoid temporal bone fracture. Overlying associated subcutaneus soft tissue edema and emphysema. 2. Otherwise no acute intracranial abnormality. 3. No acute displaced fracture or traumatic listhesis of the cervical spine. 4. Debris within bilateral external auditory canals. Finding could represent cerumen. Recommend correlation with physical  exam. 5.  Emphysema (ICD10-J43.9). Electronically Signed: By: Tish FredericksonMorgane  Naveau M.D. On: 01/29/2021 02:06   CT Angio Neck W and/or Wo Contrast  Result Date: 01/29/2021 CLINICAL DATA:  Larey SeatFell down the steps tonight. EXAM: CT ANGIOGRAPHY NECK TECHNIQUE: Multidetector CT imaging of the neck was performed using the standard protocol during bolus administration of intravenous contrast. Multiplanar CT image reconstructions and MIPs were obtained to evaluate the vascular anatomy. Carotid stenosis measurements (when applicable) are obtained utilizing NASCET criteria, using the distal internal carotid diameter as the denominator. CONTRAST:  75mL OMNIPAQUE IOHEXOL 350 MG/ML SOLN COMPARISON:  None. FINDINGS: Aortic arch: Normal Right carotid system: Normal. No bifurcation disease. No dissection. Left carotid system: Normal. No bifurcation disease. No dissection. Vertebral arteries: Both vertebral arteries are normal. Left is dominant. No stenosis or dissection. Skeleton: Mid cervical spondylosis.  No acute traumatic finding. Other neck: No soft tissue mass or lymphadenopathy. No traumatic soft tissue finding. Upper chest: Normal IMPRESSION: Normal CT angiography of the neck vessels. No evidence of vascular injury. No atherosclerotic change or dissection. No traumatic soft tissue finding. Electronically Signed   By: Paulina FusiMark  Shogry M.D.   On: 01/29/2021 03:22   CT Cervical Spine Wo Contrast  Addendum Date: 01/29/2021   ADDENDUM REPORT: 01/29/2021 02:13 ADDENDUM: These results were called by telephone at the time of interpretation on 01/29/2021 at 2:10 am to [facility] , who verbally acknowledged these results. Electronically Signed   By: Tish FredericksonMorgane  Naveau M.D.   On: 01/29/2021 02:13   Result Date: 01/29/2021 CLINICAL DATA:  Intoxicated, neck trauma.  Status post fall. EXAM: CT HEAD WITHOUT CONTRAST CT CERVICAL SPINE WITHOUT CONTRAST TECHNIQUE: Multidetector CT imaging of the head and cervical spine was performed  following the standard protocol without intravenous contrast. Multiplanar CT image reconstructions of the cervical spine were also generated. COMPARISON:  None. FINDINGS: CT HEAD FINDINGS Brain: No pneumocephalus. No evidence of large-territorial acute infarction. No parenchymal hemorrhage. No mass lesion. No extra-axial collection. No mass effect or midline shift. No hydrocephalus. Basilar cisterns are patent. Vascular: No hyperdense vessel. Skull: Nondisplaced left temporal bone fracture extending inferiorly to involve the mastoid air cells (3:19-40). Sinuses/Orbits: Paranasal sinuses and mastoid air cells are clear. The orbits are unremarkable. Other: Subcutaneus soft tissue edema and emphysema along the left temporal survey soft tissues and ears. Debris within bilateral external auditory canals. CT CERVICAL SPINE FINDINGS Alignment: Reversal of the normal cervical lordosis likely due to positioning and degenerative changes. Skull base and vertebrae: Multilevel degenerative changes of the spine that are most prominent at C5 through C7 levels. No acute fracture. No aggressive appearing focal osseous lesion or focal pathologic process. Soft tissues and spinal canal: No prevertebral fluid or swelling. No visible canal hematoma. Disc levels: Moderate intervertebral disc space narrowing at the C6-C7 level. Upper chest: Biapical paraseptal emphysematous changes. Other: None. IMPRESSION: 1. Nondisplaced left squamous and petromastoid temporal bone fracture. Overlying associated subcutaneus soft tissue edema and emphysema. 2. Otherwise no acute intracranial abnormality. 3. No acute displaced fracture or traumatic listhesis of the cervical spine. 4. Debris within bilateral external auditory canals. Finding could represent cerumen.  Recommend correlation with physical exam. 5.  Emphysema (ICD10-J43.9). Electronically Signed: By: Tish Frederickson M.D. On: 01/29/2021 02:06   CT CHEST ABDOMEN PELVIS W CONTRAST  Result Date:  01/29/2021 CLINICAL DATA:  Abdominal trauma. Status post fall. Alcohol and cocaine use. EXAM: CT CHEST, ABDOMEN, AND PELVIS WITH CONTRAST TECHNIQUE: Multidetector CT imaging of the chest, abdomen and pelvis was performed following the standard protocol during bolus administration of intravenous contrast. CONTRAST:  104mL OMNIPAQUE IOHEXOL 350 MG/ML SOLN COMPARISON:  None. FINDINGS: CHEST: Ports and Devices: None. Lungs/airways: Mild paraseptal emphysematous changes. No focal consolidation. No pulmonary nodule. No pulmonary mass. No pulmonary contusion or laceration. No pneumatocele formation. The central airways are patent. Pleura: No pleural effusion. No pneumothorax. No hemothorax. Lymph Nodes: No mediastinal, hilar, or axillary lymphadenopathy. Mediastinum: No pneumomediastinum. No aortic injury or mediastinal hematoma. The thoracic aorta is normal in caliber. The heart is normal in size. No significant pericardial effusion. The esophagus is unremarkable. The thyroid is unremarkable. Chest Wall / Breasts: No chest wall mass. Musculoskeletal: No acute rib or sternal fracture. No spinal fracture. Multilevel degenerative changes of the spine worse in the mid thoracic vertebral bodies with associated endplate sclerosis and severe intervertebral disc space narrowing. ABDOMEN / PELVIS: Liver: Not enlarged. Pericentimeter fluid density lesion within the right hepatic lobe likely represents a simple hepatic cyst. Vague hypodensity within the left hepatic lobe along the false form ligament likely represents focal fatty infiltration. Subcentimeter hypodensities are too small to characterize. Otherwise no focal lesion. No laceration or subcapsular hematoma. Biliary System: The gallbladder is otherwise unremarkable with no radio-opaque gallstones. No biliary ductal dilatation. Pancreas: Normal pancreatic contour. No main pancreatic duct dilatation. Spleen: Not enlarged. No focal lesion. No laceration, subcapsular hematoma, or  vascular injury. Adrenal Glands: No nodularity bilaterally. Kidneys: Bilateral kidneys enhance symmetrically. No hydronephrosis. No contusion, laceration, or subcapsular hematoma. No injury to the vascular structures or collecting systems. No hydroureter. The urinary bladder is distended with urine and grossly unremarkable. Bowel: PO contrast only partially opacifies the proximal small bowel. Underdistended stomach with prominent gastric rugae. No small or large bowel wall thickening or dilatation. No definite pneumatosis. The appendix is not definitely identified. Mesentery, Omentum, and Peritoneum: No simple free fluid ascites. No pneumoperitoneum. No hemoperitoneum. No mesenteric hematoma identified. No organized fluid collection. Pelvic Organs: Normal. Lymph Nodes: No abdominal, pelvic, inguinal lymphadenopathy. Vasculature: No abdominal aorta or iliac aneurysm. Moderate atherosclerotic plaque of the aorta and its branches. Musculoskeletal: No significant soft tissue hematoma. No acute pelvic fracture. No lumbar spinal fracture. Possible nondisplaced fracture of the S3/S4 vertebral bodies. L5-S1 intervertebral disc space vacuum phenomenon and endplate sclerosis. Trace retrolisthesis of L5 on S1. IMPRESSION: 1. No acute traumatic injury to the chest, abdomen, or pelvis. 2. No acute fracture or traumatic malalignment of the thoracic or lumbar spine. 3. Possible nondisplaced fracture of the S3/S4 vertebral bodies. Correlate with tenderness to palpation. 4. Underdistended stomach with prominent gastric rugae. Correlate with signs and symptoms of gastritis. 5. Aortic Atherosclerosis (ICD10-I70.0) and Emphysema (ICD10-J43.9). Electronically Signed   By: Tish Frederickson M.D.   On: 01/29/2021 03:40   DG Hip Unilat W or Wo Pelvis 2-3 Views Left  Result Date: 01/29/2021 CLINICAL DATA:  Status post fall. EXAM: DG HIP (WITH OR WITHOUT PELVIS) 2-3V LEFT COMPARISON:  None. FINDINGS: There is no evidence of hip fracture or  dislocation. There is no evidence of arthropathy or other focal bone abnormality. IMPRESSION: Negative. Electronically Signed   By: Normajean Glasgow.D.  On: 01/29/2021 02:13    ____________________________________________   PROCEDURES  Procedure(s) performed (including Critical Care):  Procedures    LACERATION REPAIR Performed by: Baxter Hire Jodine Muchmore Authorized by: Baxter Hire Erline Siddoway Consent: Verbal consent obtained. Risks and benefits: risks, benefits and alternatives were discussed Consent given by: patient Patient identity confirmed: provided demographic data Prepped and Draped in normal sterile fashion Wound explored  Laceration Location: Left scalp  Laceration Length: 4 cm  No Foreign Bodies seen or palpated  Anesthesia: local infiltration  Local anesthetic: lidocaine 2% with epinephrine  Anesthetic total: 5 ml  Irrigation method: syringe Amount of cleaning: standard  Skin closure: Superficial  Number of sutures/staples: 2 staples  Technique: Area anesthetized using lidocaine 2% with epinephrine. Wound irrigated copiously with sterile saline. Wound then cleaned with Betadine and draped in sterile fashion. Wound closed using 2 staples.  Bacitracin and sterile dressing applied. Good wound approximation and hemostasis achieved.   Patient tolerance: Patient tolerated the procedure well with no immediate complications.   CRITICAL CARE Performed by: Rochele Raring   Total critical care time: 65 minutes  Critical care time was exclusive of separately billable procedures and treating other patients.  Critical care was necessary to treat or prevent imminent or life-threatening deterioration.  Critical care was time spent personally by me on the following activities: development of treatment plan with patient and/or surrogate as well as nursing, discussions with consultants, evaluation of patient's response to treatment, examination of patient, obtaining history from patient or  surrogate, ordering and performing treatments and interventions, ordering and review of laboratory studies, ordering and review of radiographic studies, pulse oximetry and re-evaluation of patient's condition.  ____________________________________________   INITIAL IMPRESSION / ASSESSMENT AND PLAN / ED COURSE  As part of my medical decision making, I reviewed the following data within the electronic MEDICAL RECORD NUMBER History obtained from family, Nursing notes reviewed and incorporated, Labs reviewed, EKG interpreted NSR, Old EKG reviewed, Old chart reviewed, Patient signed out to oncoming EDP Dr. Vicente Males, Radiograph reviewed and CTs reviewed, A consult was requested and obtained from this/these consultant(s) Neurosurgery, Notes from prior ED visits and [place] Controlled Substance Database         Patient here with fall downstairs.  He is unsure why he fell.  Endorses drinking alcohol and using cocaine tonight.  Will obtain CT of the head and cervical spine as well as x-ray of the left hip.  His tetanus vaccination is up-to-date.  He will need repair of the laceration to the left side of his scalp.  Will give Tylenol for pain.  Will check labs, EKG to ensure no other cause for his fall today including electrolyte derangement, anemia, hypoglycemia, arrhythmia.      2:10 AM  Pt has a skull base fracture on CT of the head that is nondisplaced involving the left squamous and retromastoid temporal bones.  Otherwise no acute intracranial abnormality and no fracture or of the cervical spine.  Discussed with radiologist who recommends CTA of the neck.  Labs show alcohol level of 256 and potassium of 2.9.  Will check magnesium level.  Will replace potassium.  EKG shows no acute abnormality.  2:20 AM  Pt's wife now at bedside stating that she thinks that he fell down approximately 18 stairs.  We will add on CTA of his chest, abdomen and pelvis.  3:45 AM Pt's CTA neck shows no vessel injury.  CT of the chest,  abdomen and pelvis showed possible nondisplaced fracture of the S3/S4 vertebral bodies but  no other acute traumatic injury.  He does not have any point tenderness in this area.  Will discuss with neurosurgery on-call.  3:47 AM  Spoke with Dr. Elliot Dally on-call for neurosurgery.  Appreciate his help.  He will review patient's imaging and call me back with recommendations.  4:20 AM  NSG states conservative management.  Patient has no rhinorrhea to suggest CSF leak.  Recommends follow up in 2 weeks.  Nothing to do for sacral fractures.  Will monitor until clinically sober but anticipate dc home with NSG follow up.  7:30 AM  Pt still sleeping.  Signed out to oncoming EDP to ensure patient able to ambulate and with no new neuro deficits and no rhinorrhea prior to discharge home with patient's wife.  I reviewed all nursing notes and pertinent previous records as available.  I have reviewed and interpreted any EKGs, lab and urine results, imaging (as available).  ____________________________________________   FINAL CLINICAL IMPRESSION(S) / ED DIAGNOSES  Final diagnoses:  Trauma  Closed fracture of skull with loss of consciousness (HCC)  Alcoholic intoxication without complication (HCC)  Hypokalemia  Scalp laceration, initial encounter     ED Discharge Orders    None      *Please note:  Rick Kelly was evaluated in Emergency Department on 01/29/2021 for the symptoms described in the history of present illness. He was evaluated in the context of the global COVID-19 pandemic, which necessitated consideration that the patient might be at risk for infection with the SARS-CoV-2 virus that causes COVID-19. Institutional protocols and algorithms that pertain to the evaluation of patients at risk for COVID-19 are in a state of rapid change based on information released by regulatory bodies including the CDC and federal and state organizations. These policies and algorithms were followed  during the patient's care in the ED.  Some ED evaluations and interventions may be delayed as a result of limited staffing during and the pandemic.*   Note:  This document was prepared using Dragon voice recognition software and may include unintentional dictation errors.   Satish Hammers, Layla Maw, DO 01/29/21 [phone]

## 2021-01-29 NOTE — ED Notes (Signed)
MD in to see pt.

## 2021-01-29 NOTE — ED Notes (Signed)
Pt provided with more blankets and urinal. Pt ringing call bell again immediately after RN in room last time. Pt informed that RN will check on him, but please attempt to cluster requests. Pt states "I want what I want when I need it, sorry". Pt denies other needs now.

## 2021-01-29 NOTE — ED Notes (Signed)
NAD noted at time of D/C. D/C papers given to patient and reviewed by EDP. Pt taken from triage 1 back to lobby by this RN. Pt initially asking to use the phone then asked this RN to take him back to the parking lot to meet his wife at the car. Pt then noted to be ambulatory with new noted limp not previously noted while patient walking around the lobby prior to being arrived for 2nd visit to the car to meet his wife.

## 2021-01-29 NOTE — ED Notes (Signed)
Pt repeatedly ringing call bell for various items including to ask "what is taking so long, why am I still lying here". Pt informed that he is waiting on the doctor to assess him and he has been without staff at bedside for less than five minutes. Pt states "someone needs to call my wife and make sure she's not setting me up". Pt does not know spouse's phone number for RN to call. Pt does not have his phone. Pt informed to please allow his spouse timeto arrive to hospital. Pt states "she should have been right behind the ambulance, this is fucking crazy".

## 2022-07-12 ENCOUNTER — Emergency Department: Admission: EM | Admit: 2022-07-12 | Discharge: 2022-07-12 | Discharge status: Against Medical Advice | Payer: Self-pay

## 2022-07-12 NOTE — ED Notes (Signed)
2 attempt made to call pt and to contact at provided numbers without success.

## 2022-07-12 NOTE — ED Notes (Signed)
Attempts also made to call contact numbers listed. Attempts unsuccessful

## 2022-10-09 ENCOUNTER — Other Ambulatory Visit: Payer: Self-pay

## 2022-10-09 ENCOUNTER — Emergency Department
Admission: EM | Admit: 2022-10-09 | Discharge: 2022-10-09 | Disposition: A | Discharge status: Home / Self Care | Payer: Self-pay | Attending: Emergency Medicine | Admitting: Emergency Medicine

## 2022-10-09 DIAGNOSIS — T402X1A Poisoning by other opioids, accidental (unintentional), initial encounter: Secondary | ICD-10-CM | POA: Insufficient documentation

## 2022-10-09 DIAGNOSIS — I4891 Unspecified atrial fibrillation: Secondary | ICD-10-CM | POA: Insufficient documentation

## 2022-10-09 DIAGNOSIS — I10 Essential (primary) hypertension: Secondary | ICD-10-CM | POA: Insufficient documentation

## 2022-10-09 HISTORY — DX: Essential (primary) hypertension: I10

## 2022-10-09 MED ORDER — METOPROLOL TARTRATE 25 MG PO TABS: 12.5000 mg | ORAL_TABLET | Freq: Two times a day (BID) | ORAL | 11 refills | Status: DC

## 2022-10-09 MED ORDER — NALOXONE HCL 4 MG/0.1ML NA LIQD: NASAL | 0 refills | Status: DC

## 2022-10-09 MED ORDER — ONDANSETRON HCL 4 MG/2ML IJ SOLN
4.0000 mg | Freq: Once | INTRAMUSCULAR | Status: AC
  Administered 2022-10-09: 4 mg via INTRAVENOUS
  Filled 2022-10-09: qty 2

## 2022-10-09 MED ORDER — METOPROLOL TARTRATE 25 MG PO TABS: 12.5000 mg | ORAL_TABLET | Freq: Two times a day (BID) | ORAL | 11 refills | Status: AC

## 2022-10-09 MED ORDER — SODIUM CHLORIDE 0.9 % IV BOLUS
1000.0000 mL | Freq: Once | INTRAVENOUS | Status: AC
  Administered 2022-10-09: 1000 mL via INTRAVENOUS

## 2022-10-09 MED ORDER — DIPHENHYDRAMINE HCL 50 MG/ML IJ SOLN
25.0000 mg | Freq: Once | INTRAMUSCULAR | Status: AC
  Administered 2022-10-09: 25 mg via INTRAVENOUS
  Filled 2022-10-09: qty 1

## 2022-10-09 MED ORDER — NALOXONE HCL 4 MG/0.1ML NA LIQD: NASAL | 0 refills | Status: AC

## 2022-10-09 MED ORDER — NALOXONE HCL 4 MG/0.1ML NA LIQD
1.0000 | Freq: Once | NASAL | Status: AC
  Administered 2022-10-09: 1 via NASAL
  Filled 2022-10-09: qty 4

## 2022-10-09 NOTE — ED Notes (Signed)
Pt and family educated about how to provide dose of nasal narcan at home should it be needed.

## 2022-10-09 NOTE — ED Notes (Signed)
Pt remains somewhat lethargic at times but wakes easily to voice/touch; provider Jodi Mourning. notified. No new orders. O2 sat maintained at 95% on RA.

## 2022-10-09 NOTE — ED Notes (Signed)
Bair hugger switched from highest setting to lowest setting as pt c/o feeling excessively hot now.

## 2022-10-09 NOTE — ED Notes (Signed)
Pt currently at 99% RA.

## 2022-10-09 NOTE — ED Notes (Signed)
New thermometer used and still wouldn't read pt's temp via oral or axillary route. Pt refusing rectal temp. Will place pt on bear-hugger soon. Wife and other visitor remain at bedside.

## 2022-10-09 NOTE — ED Notes (Signed)
Urine sample and rainbow of blood sent to lab.

## 2022-10-09 NOTE — ED Notes (Addendum)
Pt suddenly very itchy and scratching all over; EDP Jacelyn Grip notified in person. EKG to provider now.

## 2022-10-09 NOTE — ED Triage Notes (Signed)
Pt in via EMS from home due to fainting; agonal resp per PD per EMS; pt bagged on scene and given 2mg  intranasal narcan by fire and EMS; 20g IV in R fa by EMS; 140/100 BP with hist HTN but pt doesn't take meds for it; HR 95-130; End tidal about 40 per EMS; RR 15; cocaine use in history.

## 2022-10-09 NOTE — ED Notes (Signed)
Bair hugger applied and on highest setting. Pt/family notified to call this RN using call bell that is within reach if needed in relation to bair hugger, if pt becomes unresponsive to voice/touch or anything else.

## 2022-10-09 NOTE — Discharge Instructions (Addendum)
Call your cardiology heart doctor to talk about your blood pressure and your atrial fibrillation, take medications as prescribed.  Thank you for choosing [place] for your health care today!  Please see your primary doctor this week for a follow up appointment.   If you do not have a primary doctor call the following clinics to establish care:  If you have insurance:  [facility] [phone] [place] [id number]   [facility]  [phone] [address] [place] [address]   If you do not have insurance:  [facility]  [phone] [address], [place] [address]  Sometimes, in the early stages of certain disease courses it is difficult to detect in the emergency department evaluation -- so, it is important that you continue to monitor your symptoms and call your doctor right away or return to the emergency department if you develop any new or worsening symptoms.  It was my pleasure to care for you today.   Hoover Brunette Jacelyn Grip, MD

## 2022-10-09 NOTE — ED Provider Notes (Addendum)
[facility] Provider Note    Event Date/Time   First MD Initiated Contact with Patient 10/09/22 1945     (approximate)   History   No chief complaint on file.   HPI  Rick Kelly is a 51 y.o. male   Past medical history of pretension who presents to the emergency department after Narcan revival.  He states that he uses cocaine and does not use opioids.  He snorted cocaine earlier today and was found minimally responsive, shallow breaths and was administered Narcan by EMS with good spots, awake alert and oriented, slightly agitated and is now nauseous.    He states no other drug use aside from the above, no alcohol use, has no other medical complaints.  After Narcan revival, he was awake, alert, slightly anxious and nauseous, tachycardic.  He was also itching so I administered Benadryl.  Afterwards he became sleepy but arousable to light verbal stimulus, maintaining respirations.  History was obtained via the patient and his family      Physical Exam   Triage Vital Signs: ED Triage Vitals  Enc Vitals Group     BP --      Pulse --      Resp --      Temp --      Temp Source 10/09/22 1935 Oral     SpO2 --      Weight 10/09/22 1938 135 lb (61.2 kg)     Height 10/09/22 1938 5\' 7"  (1.702 m)     Head Circumference --      Peak Flow --      Pain Score 10/09/22 1938 0     Pain Loc --      Pain Edu? --      Excl. in GC? --     Most recent vital signs: Vitals:   10/09/22 2148 10/09/22 2151  BP:    Pulse: 83 100  Resp:  (!) 21  Temp:    SpO2: 98% 99%    General: Awake, at first he was anxious appearing, nauseous with emesis bag in his hand.  CV:  Good peripheral perfusion.  Resp:  Normal effort.  Abd:  No distention.  Other:  Tachycardic.   ED Results / Procedures / Treatments   Labs (all labs ordered are listed, but only abnormal results are displayed) Labs Reviewed - No data to display   EKG  ED ECG REPORT I, Pilar JarvisSilas  Alder Murri, the attending physician, personally viewed and interpreted this ECG.   Date: 10/09/2022  EKG Time: 1944  Rate: 110  Rhythm: atrial fibrillation, rate 110  Axis: normal  Intervals:no stemi   PROCEDURES:  Critical Care performed: No  Procedures   MEDICATIONS ORDERED IN ED: Medications  sodium chloride 0.9 % bolus 1,000 mL (0 mLs Intravenous Stopped 10/09/22 2124)  diphenhydrAMINE (BENADRYL) injection 25 mg (25 mg Intravenous Given 10/09/22 2002)  ondansetron (ZOFRAN) injection 4 mg (4 mg Intravenous Given 10/09/22 2001)     IMPRESSION / MDM / ASSESSMENT AND PLAN / ED COURSE  I reviewed the triage vital signs and the nursing notes.                              Differential diagnosis includes, but is not limited to, unintentional opioid overdose, new onset atrial fibrillation with RVR, normotensive.   MDM: Patient with Narcan revival with good response, will observe with Narcan revival.  He also  has atrial fibrillation with RVR, rate okay at this time 433-295, new onset, uncertain time course still may have started with Narcan revival sympathetic response, though cannot be certain on timing so will give fluids, rate control as needed, defer cardioversion at this time and if he is stable for discharge I will start him on metoprolol low-dose and have him follow-up with cardiology.  He is now 2+ hours after Narcan revival and is sleepy but arouses easily to verbal stimulus, can stay awake for entire conversation, and his atrial fibrillation rate is now from 80s to 100s.  He remains hypertensive, 188C to 166A systolic, has no other complaints at this time and would like to go home with his family.  He has been feeling well, no preceding illnesses.  At this time, he will be discharged home and started on low-dose metoprolol for hypertension and new A-fib, I gave him cardiology's phone number to call for follow-up appointment for both atrial fibrillation and hypertension as well as a  prescription for Narcan to his pharmacy.   Patient's presentation is most consistent with acute presentation with potential threat to life or bodily function.       FINAL CLINICAL IMPRESSION(S) / ED DIAGNOSES   Final diagnoses:  Opioid overdose, accidental or unintentional, initial encounter ([place])  Atrial fibrillation with RVR ([place])  Uncontrolled hypertension     Rx / DC Orders   ED Discharge Orders          Ordered    naloxone (NARCAN) nasal spray 4 mg/0.1 mL        10/09/22 2149    metoprolol tartrate (LOPRESSOR) 25 MG tablet  2 times daily        10/09/22 2149             Note:  This document was prepared using Dragon voice recognition software and may include unintentional dictation errors.    Lucillie Garfinkel, MD 10/09/22 2056    Lucillie Garfinkel, MD 10/09/22 2159

## 2022-10-09 NOTE — ED Notes (Signed)
Pt denies pain but states "I feel like I can't breathe"; O2 sat 98% currently on RA.

## 2022-10-09 NOTE — ED Notes (Signed)
Will attempt temp with other thermometer bc tried for 3rd time with one on wall in pt's room and couldn't obtain temp reading. Pt already has 3 warm blankets.

## 2022-12-28 ENCOUNTER — Ambulatory Visit: Payer: Self-pay | Admitting: Nurse Practitioner

## 2022-12-28 DIAGNOSIS — I10 Essential (primary) hypertension: Secondary | ICD-10-CM | POA: Insufficient documentation

## 2022-12-28 DIAGNOSIS — Z202 Contact with and (suspected) exposure to infections with a predominantly sexual mode of transmission: Secondary | ICD-10-CM

## 2022-12-28 DIAGNOSIS — Z113 Encounter for screening for infections with a predominantly sexual mode of transmission: Secondary | ICD-10-CM

## 2022-12-28 LAB — HM HIV SCREENING LAB: HM HIV Screening: NEGATIVE

## 2022-12-28 LAB — HEPATITIS B SURFACE ANTIGEN: Hepatitis B Surface Ag: NONREACTIVE

## 2022-12-28 LAB — HM HEPATITIS C SCREENING LAB: HM Hepatitis Screen: NEGATIVE

## 2022-12-28 MED ORDER — METRONIDAZOLE 500 MG PO TABS: 500.0000 mg | ORAL_TABLET | Freq: Every day | ORAL | 0 refills | Status: AC

## 2022-12-28 MED ORDER — METRONIDAZOLE 500 MG PO TABS: 500.0000 mg | ORAL_TABLET | Freq: Two times a day (BID) | ORAL | 0 refills | Status: DC

## 2022-12-28 NOTE — Progress Notes (Signed)
[facility] Department STI clinic/screening visit  Subjective:  Rick Kelly is a 52 y.o. male being seen today for an STI screening visit. The patient reports they do not have symptoms.    Patient has the following medical conditions:   Patient Active Problem List   Diagnosis Date Noted   HTN (hypertension) 12/28/2022     Chief Complaint  Patient presents with   SEXUALLY TRANSMITTED DISEASE    Contact to Trich    HPI  Patient reports to clinic today for an STD screening.  Patient reports being a contact to Dalbert Batman and is currently asymptomatic.    Last HIV test per patient/review of record was: 08/2020  Does the patient or their partner desires a pregnancy in the next year? No  Screening for MPX risk: Does the patient have an unexplained rash? No Is the patient MSM? No Does the patient endorse multiple sex partners or anonymous sex partners? Yes Did the patient have close or sexual contact with a person diagnosed with MPX? No Has the patient traveled outside the [place] where MPX is endemic? No Is there a high clinical suspicion for MPX-- evidenced by one of the following No  -Unlikely to be chickenpox  -Lymphadenopathy  -Rash that present in same phase of evolution on any given body part   See flowsheet for further details and programmatic requirements.   Immunization History  Administered Date(s) Administered   PFIZER(Purple Top)SARS-COV-2 Vaccination 04/20/2020, 05/11/2020     The following portions of the patient's history were reviewed and updated as appropriate: allergies, current medications, past medical history, past social history, past surgical history and problem list.  Objective:  There were no vitals filed for this visit.  Physical Exam Constitutional:      Appearance: Normal appearance.  HENT:     Head: Normocephalic. No abrasion, masses or laceration. Hair is normal.     Right Ear: External ear normal.     Left Ear: External  ear normal.     Nose: Nose normal.     Mouth/Throat:     Lips: Pink.     Mouth: Mucous membranes are moist. No oral lesions.     Pharynx: No pharyngeal swelling, oropharyngeal exudate, posterior oropharyngeal erythema or uvula swelling.     Tonsils: No tonsillar exudate or tonsillar abscesses.  Eyes:     General: Lids are normal.        Right eye: No discharge.        Left eye: No discharge.     Conjunctiva/sclera: Conjunctivae normal.     Right eye: No exudate.    Left eye: No exudate. Pulmonary:     Effort: Pulmonary effort is normal.  Abdominal:     General: Abdomen is flat.     Palpations: Abdomen is soft.     Tenderness: There is no abdominal tenderness. There is no rebound.  Genitourinary:    Pubic Area: No rash or pubic lice.      Penis: Normal and circumcised. No erythema or discharge.      Testes: Normal.        Right: Mass or tenderness not present.        Left: Mass or tenderness not present.     Rectum: Normal.     Comments: Discharge amount: none Color: none  Musculoskeletal:     Cervical back: Full passive range of motion without pain, normal range of motion and neck supple.  Lymphadenopathy:     Cervical: No cervical adenopathy.  Right cervical: No superficial, deep or posterior cervical adenopathy.    Left cervical: No superficial, deep or posterior cervical adenopathy.     Upper Body:     Right upper body: No supraclavicular, axillary or epitrochlear adenopathy.     Left upper body: No supraclavicular, axillary or epitrochlear adenopathy.     Lower Body: No right inguinal adenopathy. No left inguinal adenopathy.  Skin:    General: Skin is warm and dry.     Findings: No lesion or rash.  Neurological:     Mental Status: He is alert and oriented to person, place, and time.  Psychiatric:        Attention and Perception: Attention normal.        Mood and Affect: Mood normal.        Speech: Speech normal.        Behavior: Behavior normal. Behavior is  cooperative.       Assessment and Plan:  Rick Kelly is a 52 y.o. male presenting to the [facility] Department for STI screening  1. Screening examination for venereal disease -52 year old male in clinic today for STD screening. -Patient does not have STI symptoms Patient accepted all screenings including urine CT/GC and bloodwork for HIV/RPR.  Patient meets criteria for HepB screening? Yes. Ordered? Yes Patient meets criteria for HepC screening? Yes. Ordered? Yes Recommended condom use with all sex Discussed importance of condom use for STI prevent  Discussed time line for State Lab results and that patient will be called with positive results and encouraged patient to call if he had not heard in 2 weeks Recommended returning for continued or worsening symptoms.     - HIV/HCV [facility] - Syphilis Serology, [place] - HBV Antigen/Antibody [facility] - Chlamydia/GC NAA, Confirmation  2. Exposure to trichomonas -Treat patient as a contact Trich.  Advised patient not to have sex for 7 days and 7 days after partner is treated.   - metroNIDAZOLE (FLAGYL) 500 MG tablet; Take 1 tablet (500 mg total) by mouth daily.  Dispense: 4 tablet; Refill: 0   Total time spent: 20 minutes   Return if symptoms worsen or fail to improve.  Gregary Cromer, FNP

## 2023-01-01 LAB — CHLAMYDIA/GC NAA, CONFIRMATION
Chlamydia trachomatis, NAA: NEGATIVE
Neisseria gonorrhoeae, NAA: NEGATIVE
